# Patient Record
Sex: Male | Born: 1965
Health system: Southern US, Community
[De-identification: ages and names within clinical notes are randomized; demographics above are authoritative.]

## PROBLEM LIST (undated history)

## (undated) DIAGNOSIS — J45909 Unspecified asthma, uncomplicated: Secondary | ICD-10-CM

## (undated) DIAGNOSIS — J301 Allergic rhinitis due to pollen: Secondary | ICD-10-CM

## (undated) DIAGNOSIS — T7840XA Allergy, unspecified, initial encounter: Secondary | ICD-10-CM

## (undated) DIAGNOSIS — H409 Unspecified glaucoma: Secondary | ICD-10-CM

## (undated) HISTORY — DX: Allergy, unspecified, initial encounter: T78.40XA

## (undated) HISTORY — DX: Unspecified glaucoma: H40.9

## (undated) HISTORY — PX: EYE SURGERY: SHX253

## (undated) HISTORY — DX: Unspecified asthma, uncomplicated: J45.909

## (undated) HISTORY — PX: TONSILLECTOMY: SUR1361

---

## 2007-02-05 ENCOUNTER — Encounter (INDEPENDENT_AMBULATORY_CARE_PROVIDER_SITE_OTHER): Payer: Self-pay | Admitting: Ophthalmology

## 2007-02-05 ENCOUNTER — Ambulatory Visit (HOSPITAL_COMMUNITY): Admission: RE | Admit: 2007-02-05 | Discharge: 2007-02-05 | Payer: Self-pay | Admitting: Ophthalmology
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
## 2010-10-14 LAB — BASIC METABOLIC PANEL
BUN: 15
Creatinine, Ser: 1.1
GFR calc non Af Amer: >60
Glucose, Bld: 96

## 2010-10-14 LAB — CBC
MCHC: 33.4
Platelets: 168
RDW: 13.4

## 2010-10-14 LAB — TISSUE CULTURE: Culture: NO GROWTH

## 2010-10-14 LAB — ANAEROBIC CULTURE

## 2014-03-26 ENCOUNTER — Emergency Department (HOSPITAL_COMMUNITY): Payer: BLUE CROSS/BLUE SHIELD

## 2014-03-26 ENCOUNTER — Encounter (HOSPITAL_COMMUNITY): Payer: Self-pay | Admitting: *Deleted

## 2014-03-26 ENCOUNTER — Inpatient Hospital Stay (HOSPITAL_COMMUNITY)
Admission: EM | Admit: 2014-03-26 | Discharge: 2014-03-29 | DRG: 087 | Disposition: A | Discharge status: Home / Self Care | Payer: BLUE CROSS/BLUE SHIELD | Attending: Neurosurgery | Admitting: Neurosurgery

## 2014-03-26 DIAGNOSIS — S0219XA Other fracture of base of skull, initial encounter for closed fracture: Principal | ICD-10-CM | POA: Diagnosis present

## 2014-03-26 DIAGNOSIS — S065XAA Traumatic subdural hemorrhage with loss of consciousness status unknown, initial encounter: Secondary | ICD-10-CM | POA: Diagnosis present

## 2014-03-26 DIAGNOSIS — S065X1A Traumatic subdural hemorrhage with loss of consciousness of 30 minutes or less, initial encounter: Secondary | ICD-10-CM | POA: Diagnosis not present

## 2014-03-26 DIAGNOSIS — R40241 Glasgow coma scale score 13-15: Secondary | ICD-10-CM | POA: Diagnosis present

## 2014-03-26 DIAGNOSIS — S065X9A Traumatic subdural hemorrhage with loss of consciousness of unspecified duration, initial encounter: Secondary | ICD-10-CM | POA: Diagnosis present

## 2014-03-26 DIAGNOSIS — S01511A Laceration without foreign body of lip, initial encounter: Secondary | ICD-10-CM | POA: Diagnosis present

## 2014-03-26 DIAGNOSIS — S02119A Unspecified fracture of occiput, initial encounter for closed fracture: Secondary | ICD-10-CM | POA: Diagnosis present

## 2014-03-26 HISTORY — DX: Allergic rhinitis due to pollen: J30.1

## 2014-03-26 LAB — COMPREHENSIVE METABOLIC PANEL
ALK PHOS: 44 U/L (ref 39–117)
ALT: 23 U/L (ref 0–53)
ANION GAP: 11 (ref 5–15)
AST: 28 U/L (ref 0–37)
Albumin: 4.4 g/dL (ref 3.5–5.2)
BILIRUBIN TOTAL: 0.8 mg/dL (ref 0.3–1.2)
BUN: 12 mg/dL (ref 6–23)
CHLORIDE: 103 mmol/L (ref 96–112)
CO2: 23 mmol/L (ref 19–32)
CREATININE: 1.05 mg/dL (ref 0.50–1.35)
Calcium: 9.1 mg/dL (ref 8.4–10.5)
GFR, EST NON AFRICAN AMERICAN: 83 mL/min — AB (ref >90)
GLUCOSE: 139 mg/dL — AB (ref 70–99)
POTASSIUM: 3.4 mmol/L — AB (ref 3.5–5.1)
Sodium: 137 mmol/L (ref 135–145)
Total Protein: 7.1 g/dL (ref 6.0–8.3)

## 2014-03-26 LAB — CBC WITH DIFFERENTIAL/PLATELET
BASOS PCT: 0 % (ref 0–1)
Basophils Absolute: 0 10*3/uL (ref 0.0–0.1)
EOS ABS: 0 10*3/uL (ref 0.0–0.7)
EOS PCT: 0 % (ref 0–5)
HEMATOCRIT: 40.6 % (ref 39.0–52.0)
HEMOGLOBIN: 13.5 g/dL (ref 13.0–17.0)
LYMPHS ABS: 1.3 10*3/uL (ref 0.7–4.0)
Lymphocytes Relative: 10 % — ABNORMAL LOW (ref 12–46)
MCH: 31.1 pg (ref 26.0–34.0)
MCHC: 33.3 g/dL (ref 30.0–36.0)
MCV: 93.5 fL (ref 78.0–100.0)
MONO ABS: 1.2 10*3/uL — AB (ref 0.1–1.0)
MONOS PCT: 9 % (ref 3–12)
NEUTROS ABS: 10.9 10*3/uL — AB (ref 1.7–7.7)
NEUTROS PCT: 81 % — AB (ref 43–77)
PLATELETS: 131 10*3/uL — AB (ref 150–400)
RBC: 4.34 MIL/uL (ref 4.22–5.81)
RDW: 13.7 % (ref 11.5–15.5)
WBC: 13.4 10*3/uL — ABNORMAL HIGH (ref 4.0–10.5)

## 2014-03-26 LAB — PROTIME-INR
INR: 1.12 (ref 0.00–1.49)
Prothrombin Time: 14.6 seconds (ref 11.6–15.2)

## 2014-03-26 LAB — ETHANOL: Alcohol, Ethyl (B): 43 mg/dL — ABNORMAL HIGH (ref 0–9)

## 2014-03-26 MED ORDER — TETANUS-DIPHTH-ACELL PERTUSSIS 5-2.5-18.5 LF-MCG/0.5 IM SUSP
0.5000 mL | Freq: Once | INTRAMUSCULAR | Status: AC
  Administered 2014-03-27: 0.5 mL via INTRAMUSCULAR
  Filled 2014-03-26: qty 0.5

## 2014-03-26 MED ORDER — PROMETHAZINE HCL 25 MG/ML IJ SOLN
12.5000 mg | Freq: Once | INTRAMUSCULAR | Status: AC
  Administered 2014-03-27: 12.5 mg via INTRAVENOUS
  Filled 2014-03-26: qty 1

## 2014-03-26 MED ORDER — ONDANSETRON HCL 4 MG/2ML IJ SOLN
4.0000 mg | Freq: Once | INTRAMUSCULAR | Status: AC
  Administered 2014-03-26: 4 mg via INTRAVENOUS
  Filled 2014-03-26: qty 2

## 2014-03-26 MED ORDER — LIDOCAINE-EPINEPHRINE (PF) 2 %-1:200000 IJ SOLN
20.0000 mL | Freq: Once | INTRAMUSCULAR | Status: AC
  Administered 2014-03-27: 10 mL
  Filled 2014-03-26: qty 20
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
## 2014-03-27 ENCOUNTER — Encounter (HOSPITAL_COMMUNITY): Payer: Self-pay

## 2014-03-27 ENCOUNTER — Inpatient Hospital Stay (HOSPITAL_COMMUNITY): Payer: BLUE CROSS/BLUE SHIELD

## 2014-03-27 DIAGNOSIS — S01511A Laceration without foreign body of lip, initial encounter: Secondary | ICD-10-CM | POA: Diagnosis present

## 2014-03-27 DIAGNOSIS — S065X9A Traumatic subdural hemorrhage with loss of consciousness of unspecified duration, initial encounter: Secondary | ICD-10-CM | POA: Diagnosis present

## 2014-03-27 DIAGNOSIS — S02119A Unspecified fracture of occiput, initial encounter for closed fracture: Secondary | ICD-10-CM | POA: Diagnosis present

## 2014-03-27 DIAGNOSIS — S0219XA Other fracture of base of skull, initial encounter for closed fracture: Secondary | ICD-10-CM | POA: Diagnosis present

## 2014-03-27 DIAGNOSIS — R40241 Glasgow coma scale score 13-15: Secondary | ICD-10-CM | POA: Diagnosis present

## 2014-03-27 DIAGNOSIS — S065X1A Traumatic subdural hemorrhage with loss of consciousness of 30 minutes or less, initial encounter: Secondary | ICD-10-CM | POA: Diagnosis present

## 2014-03-27 DIAGNOSIS — S065XAA Traumatic subdural hemorrhage with loss of consciousness status unknown, initial encounter: Secondary | ICD-10-CM | POA: Diagnosis present

## 2014-03-27 LAB — URINALYSIS, ROUTINE W REFLEX MICROSCOPIC
Bilirubin Urine: NEGATIVE
GLUCOSE, UA: NEGATIVE mg/dL
HGB URINE DIPSTICK: NEGATIVE
KETONES UR: 40 mg/dL — AB
Leukocytes, UA: NEGATIVE
NITRITE: NEGATIVE
PROTEIN: NEGATIVE mg/dL
Specific Gravity, Urine: 1.023 (ref 1.005–1.030)
UROBILINOGEN UA: 0.2 mg/dL (ref 0.0–1.0)
pH: 6 (ref 5.0–8.0)

## 2014-03-27 LAB — RAPID URINE DRUG SCREEN, HOSP PERFORMED
Amphetamines: NOT DETECTED
BENZODIAZEPINES: NOT DETECTED
Barbiturates: NOT DETECTED
COCAINE: NOT DETECTED
OPIATES: NOT DETECTED
Tetrahydrocannabinol: NOT DETECTED

## 2014-03-27 MED ORDER — OXYCODONE HCL 5 MG PO TABS
5.0000 mg | ORAL_TABLET | ORAL | Status: DC | PRN
  Administered 2014-03-27 – 2014-03-29 (×8): 5 mg via ORAL
  Filled 2014-03-27 (×8): qty 1

## 2014-03-27 MED ORDER — ACETAMINOPHEN 650 MG RE SUPP: 650.0000 mg | Freq: Four times a day (QID) | RECTAL | Status: DC | PRN

## 2014-03-27 MED ORDER — POTASSIUM CHLORIDE IN NACL 20-0.9 MEQ/L-% IV SOLN
INTRAVENOUS | Status: DC
  Administered 2014-03-27 – 2014-03-28 (×3): via INTRAVENOUS
  Administered 2014-03-28: 1000 mL via INTRAVENOUS
  Filled 2014-03-27 (×5): qty 1000

## 2014-03-27 MED ORDER — ACETAMINOPHEN 325 MG PO TABS: 650.0000 mg | ORAL_TABLET | Freq: Four times a day (QID) | ORAL | Status: DC | PRN

## 2014-03-27 MED ORDER — HYDROMORPHONE HCL 1 MG/ML IJ SOLN
0.5000 mg | INTRAMUSCULAR | Status: DC | PRN
  Administered 2014-03-27 – 2014-03-28 (×5): 0.5 mg via INTRAVENOUS
  Filled 2014-03-27 (×5): qty 1

## 2014-03-27 MED ORDER — ONDANSETRON HCL 4 MG/2ML IJ SOLN
4.0000 mg | Freq: Four times a day (QID) | INTRAMUSCULAR | Status: DC | PRN
  Administered 2014-03-27: 4 mg via INTRAVENOUS
  Filled 2014-03-27: qty 2
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
## 2014-03-27 NOTE — Progress Notes (Signed)
Patient ID: Dustin Mitchell, male   DOB: 06/24/1965, 49 y.o.   MRN: [id number] Patient tells better this morning less headache less nausea still denies any numbness and tingling in his arms or his legs.  Awake alert and oriented left pupil reactive strength 5 out of 5  CT scan stable and improving.  Mobilized day transfer to the floor when patient's tolerating by mouth and nausea well-controlled patient to be discharged home.
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
## 2014-03-27 NOTE — Progress Notes (Signed)
UR completed.  Lauralei Clouse, RN BSN [facility] CCM Trauma/Neuro ICU Case Manager [phone]  
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
## 2014-03-28 MED ORDER — OXYCODONE HCL 5 MG PO TABS: 5.0000 mg | ORAL_TABLET | ORAL | Status: DC | PRN
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
## 2014-03-29 NOTE — Progress Notes (Signed)
Reviewed discharge instructions with patient and spouse. One rx given. Tolerated diet well last night and this morning and ambulated in the hallway several times. Vital signs stable and pain well controlled with oral pain med. Volunteers transported patient to the lobby via wheelchair. Pt was discharged at 1113.
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
## 2014-04-15 ENCOUNTER — Other Ambulatory Visit: Payer: Self-pay | Admitting: Neurosurgery

## 2014-04-15 DIAGNOSIS — S065X9A Traumatic subdural hemorrhage with loss of consciousness of unspecified duration, initial encounter: Secondary | ICD-10-CM

## 2014-04-15 DIAGNOSIS — S065XAA Traumatic subdural hemorrhage with loss of consciousness status unknown, initial encounter: Secondary | ICD-10-CM

## 2014-04-21 ENCOUNTER — Ambulatory Visit
Admission: RE | Admit: 2014-04-21 | Discharge: 2014-04-21 | Disposition: A | Discharge status: Home / Self Care | Payer: BLUE CROSS/BLUE SHIELD | Source: Ambulatory Visit | Attending: Neurosurgery | Admitting: Neurosurgery

## 2014-04-21 DIAGNOSIS — S065XAA Traumatic subdural hemorrhage with loss of consciousness status unknown, initial encounter: Secondary | ICD-10-CM

## 2014-04-21 DIAGNOSIS — S065X9A Traumatic subdural hemorrhage with loss of consciousness of unspecified duration, initial encounter: Secondary | ICD-10-CM
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
## 2016-06-08 DIAGNOSIS — Z4421 Encounter for fitting and adjustment of artificial right eye: Secondary | ICD-10-CM | POA: Diagnosis not present

## 2016-06-23 DIAGNOSIS — Z9001 Acquired absence of eye: Secondary | ICD-10-CM | POA: Diagnosis not present

## 2016-06-23 DIAGNOSIS — H2512 Age-related nuclear cataract, left eye: Secondary | ICD-10-CM | POA: Diagnosis not present

## 2016-06-23 DIAGNOSIS — H02401 Unspecified ptosis of right eyelid: Secondary | ICD-10-CM | POA: Diagnosis not present

## 2016-06-23 DIAGNOSIS — H04201 Unspecified epiphora, right lacrimal gland: Secondary | ICD-10-CM | POA: Diagnosis not present

## 2016-07-11 ENCOUNTER — Ambulatory Visit (INDEPENDENT_AMBULATORY_CARE_PROVIDER_SITE_OTHER): Payer: BLUE CROSS/BLUE SHIELD | Admitting: Physician Assistant

## 2016-07-11 ENCOUNTER — Encounter: Payer: Self-pay | Admitting: Physician Assistant

## 2016-07-11 VITALS — BP 110/70 | HR 45 | Temp 97.5°F | Resp 16 | Ht 72.0 in | Wt 185.2 lb

## 2016-07-11 DIAGNOSIS — J029 Acute pharyngitis, unspecified: Secondary | ICD-10-CM | POA: Diagnosis not present

## 2016-07-11 LAB — POCT RAPID STREP A (OFFICE): Rapid Strep A Screen: NEGATIVE

## 2016-07-11 MED ORDER — AZITHROMYCIN 250 MG PO TABS: ORAL_TABLET | ORAL | 0 refills | Status: DC

## 2016-07-11 NOTE — Progress Notes (Signed)
   Dustin Mitchell  MRN: [id number] DOB: June 25, 1965  PCP: Patient, No Pcp Per  Subjective:  Pt is a 51 year old male who presents to clinic for sore throat x 3 weeks. Pain has come and gone over the past few weeks. When it first started it was 8/10 pain. He stayed in bed for a day or two. Pain improved and has been intermittent since that time. Today c/o 2/10 pain.  He has not taken anything to feel better. He endorses some runny nose and mild cough.  Denies fever, chills, difficulty swallowing, drooling, n/v, trismus.    Review of Systems  Constitutional: Negative for chills, diaphoresis, fatigue and fever.  HENT: Positive for postnasal drip, rhinorrhea and sore throat. Negative for sinus pain, sinus pressure, sneezing, trouble swallowing and voice change.   Respiratory: Positive for cough. Negative for chest tightness and shortness of breath.   Gastrointestinal: Negative for nausea and vomiting.  Psychiatric/Behavioral: Negative for sleep disturbance.    Patient Active Problem List   Diagnosis Date Noted  . SDH (subdural hematoma) (HCC) 03/27/2014    Current Outpatient Prescriptions on File Prior to Visit  Medication Sig Dispense Refill  . oxyCODONE (OXY IR/ROXICODONE) 5 MG immediate release tablet Take 1 tablet (5 mg total) by mouth every 4 (four) hours as needed for moderate pain. (Patient not taking: Reported on 07/11/2016) 60 tablet 0   No current facility-administered medications on file prior to visit.     Allergies  Allergen Reactions  . Penicillins     unknown     Objective:  BP 110/70 (BP Location: Left Arm, Cuff Size: Normal)   Pulse (!) 45   Temp 97.5 F (36.4 C) (Oral)   Resp 16   Ht 6' (1.829 m)   Wt 185 lb 3.2 oz (84 kg)   SpO2 100%   BMI 25.12 kg/m   Physical Exam  Constitutional: He is oriented to person, place, and time and well-developed, well-nourished, and in no distress. No distress.  HENT:  Right Ear: Tympanic membrane normal.  Left Ear:  Tympanic membrane normal.  Mouth/Throat: Mucous membranes are normal. Posterior oropharyngeal erythema present. No oropharyngeal exudate or posterior oropharyngeal edema.  Cardiovascular: Normal rate, regular rhythm and normal heart sounds.   Neurological: He is alert and oriented to person, place, and time. GCS score is 15.  Skin: Skin is warm and dry.  Psychiatric: Mood, memory, affect and judgment normal.  Vitals reviewed.   Results for orders placed or performed in visit on 07/11/16  POCT rapid strep A  Result Value Ref Range   Rapid Strep A Screen Negative Negative    Assessment and Plan :  1. Acute pharyngitis, unspecified etiology 2. Sore throat - azithromycin (ZITHROMAX) 250 MG tablet; Take 2 tabs PO x 1 dose, then 1 tab PO QD x 4 days  Dispense: 6 tablet; Refill: 0 - POCT rapid strep A - Culture, Group A Strep - Rapid strep is negative, suspect possible false negative due to length of illness. Will treat. Culture is pending. Supportive care discussed: salt water gargles, warm tea with honey, ibuprofen for pain, flonase and neti pot. RTC in 5-7 days if no improvement. He agrees with plan.   Marco CollieWhitney Jacobe Study, PA-C  [facility] 07/11/2016 2:27 PM

## 2016-07-11 NOTE — Patient Instructions (Addendum)
Come back if you are not better in 5-7 days. Please see below for home care tips. Warm tea with honey helps sore throat.  Try over-the-counter Flonase, this will help with possible post-nasal drip (PND), which is a big contributor to sore throats. Using nasal rinses (ie neti pot) will help reduce PND.   Thank you for coming in today. I hope you feel we met your needs.  Feel free to call [facility] if you have any questions or further requests.  Please consider signing up for MyChart if you do not already have it, as this is a great way to communicate with me.  Best,  Whitney McVey, PA-C   Pharyngitis Pharyngitis is redness, pain, and swelling (inflammation) of your pharynx. What are the causes? Pharyngitis is usually caused by infection. Most of the time, these infections are from viruses (viral) and are part of a cold. However, sometimes pharyngitis is caused by bacteria (bacterial). Pharyngitis can also be caused by allergies. Viral pharyngitis may be spread from person to person by coughing, sneezing, and personal items or utensils (cups, forks, spoons, toothbrushes). Bacterial pharyngitis may be spread from person to person by more intimate contact, such as kissing. What are the signs or symptoms? Symptoms of pharyngitis include:  Sore throat.  Tiredness (fatigue).  Low-grade fever.  Headache.  Joint pain and muscle aches.  Skin rashes.  Swollen lymph nodes.  Plaque-like film on throat or tonsils (often seen with bacterial pharyngitis).  How is this treated? Viral pharyngitis will usually get better in 3-4 days without the use of medicine. Bacterial pharyngitis is treated with medicines that kill germs (antibiotics). Follow these instructions at home:  Drink enough water and fluids to keep your urine clear or pale yellow.  Only take over-the-counter or prescription medicines as directed by your health care provider: ? If you are prescribed antibiotics, make sure you finish them  even if you start to feel better. ? Do not take aspirin.  Get lots of rest.  Gargle with 8 oz of salt water ( tsp of salt per 1 qt of water) as often as every 1-2 hours to soothe your throat.  Throat lozenges (if you are not at risk for choking) or sprays may be used to soothe your throat. Contact a health care provider if:  You have large, tender lumps in your neck.  You have a rash.  You cough up green, yellow-brown, or bloody spit. Get help right away if:  Your neck becomes stiff.  You drool or are unable to swallow liquids.  You vomit or are unable to keep medicines or liquids down.  You have severe pain that does not go away with the use of recommended medicines.  You have trouble breathing (not caused by a stuffy nose). This information is not intended to replace advice given to you by your health care provider. Make sure you discuss any questions you have with your health care provider. Document Released: 01/10/2005 Document Revised: 06/18/2015 Document Reviewed: 09/17/2012 Elsevier Interactive Patient Education  2017 [employer].   IF you received an x-ray today, you will receive an invoice from [facility]. Please contact [facility] at [phone] with questions or concerns regarding your invoice.   IF you received labwork today, you will receive an invoice from [place]. Please contact [facility] at [phone] with questions or concerns regarding your invoice.   Our billing staff will not be able to assist you with questions regarding bills from these companies.  You will be  contacted with the lab results as soon as they are available. The fastest way to get your results is to activate your My Chart account. Instructions are located on the last page of this paperwork. If you have not heard from [place] regarding the results in 2 weeks, please contact this office.

## 2016-07-13 LAB — CULTURE, GROUP A STREP: Strep A Culture: NEGATIVE

## 2016-08-17 DIAGNOSIS — D2212 Melanocytic nevi of left eyelid, including canthus: Secondary | ICD-10-CM | POA: Diagnosis not present

## 2016-11-17 DIAGNOSIS — Z4421 Encounter for fitting and adjustment of artificial right eye: Secondary | ICD-10-CM | POA: Diagnosis not present

## 2017-05-30 ENCOUNTER — Encounter: Payer: Self-pay | Admitting: Physician Assistant

## 2017-05-30 ENCOUNTER — Other Ambulatory Visit: Payer: Self-pay

## 2017-05-30 ENCOUNTER — Ambulatory Visit (INDEPENDENT_AMBULATORY_CARE_PROVIDER_SITE_OTHER): Payer: BLUE CROSS/BLUE SHIELD | Admitting: Physician Assistant

## 2017-05-30 VITALS — BP 130/78 | HR 55 | Temp 98.5°F | Resp 16 | Ht 71.5 in | Wt 188.8 lb

## 2017-05-30 DIAGNOSIS — Z1329 Encounter for screening for other suspected endocrine disorder: Secondary | ICD-10-CM

## 2017-05-30 DIAGNOSIS — Z13220 Encounter for screening for lipoid disorders: Secondary | ICD-10-CM

## 2017-05-30 DIAGNOSIS — Z1211 Encounter for screening for malignant neoplasm of colon: Secondary | ICD-10-CM | POA: Diagnosis not present

## 2017-05-30 DIAGNOSIS — Z130 Encounter for screening for diseases of the blood and blood-forming organs and certain disorders involving the immune mechanism: Secondary | ICD-10-CM

## 2017-05-30 DIAGNOSIS — Z0000 Encounter for general adult medical examination without abnormal findings: Secondary | ICD-10-CM | POA: Diagnosis not present

## 2017-05-30 DIAGNOSIS — L255 Unspecified contact dermatitis due to plants, except food: Secondary | ICD-10-CM

## 2017-05-30 DIAGNOSIS — J45990 Exercise induced bronchospasm: Secondary | ICD-10-CM | POA: Diagnosis not present

## 2017-05-30 DIAGNOSIS — Z13228 Encounter for screening for other metabolic disorders: Secondary | ICD-10-CM | POA: Diagnosis not present

## 2017-05-30 LAB — POCT URINALYSIS DIP (MANUAL ENTRY)
Bilirubin, UA: NEGATIVE
Blood, UA: NEGATIVE
Glucose, UA: NEGATIVE mg/dL
Ketones, POC UA: NEGATIVE mg/dL
Leukocytes, UA: NEGATIVE
Nitrite, UA: NEGATIVE
Protein Ur, POC: NEGATIVE mg/dL
Spec Grav, UA: 1.01 (ref 1.010–1.025)
Urobilinogen, UA: 0.2 U/dL
pH, UA: 6 (ref 5.0–8.0)

## 2017-05-30 MED ORDER — CLOBETASOL PROPIONATE 0.05 % EX LOTN: 1.0000 "application " | TOPICAL_LOTION | Freq: Two times a day (BID) | CUTANEOUS | 0 refills | Status: DC

## 2017-05-30 MED ORDER — ALBUTEROL SULFATE HFA 108 (90 BASE) MCG/ACT IN AERS: 2.0000 | INHALATION_SPRAY | RESPIRATORY_TRACT | 1 refills | Status: DC | PRN

## 2017-05-30 NOTE — Progress Notes (Signed)
Primary Care at [place], [place] [address] [phone]  Date:  05/30/2017   Name:  Dustin Mitchell   DOB:  [birth year]   MRN:  [id number]  PCP:  Patient, No Pcp Per    Chief Complaint: Annual Exam   History of Present Illness:  This is a 52 y.o. male who is presenting for CPE. This is his first annual in several years.   Complaints:   Plant dermatitis - develops rashes every summer while working in the yard "there is poison ivy everywhere".  Exercise induced asthma. Goes for 5 hour bike rides chest feels really tight after a few hours and cannot take deep breath. Takes about 1 hour to resolve. Nothing makes it better but time. No h/o asthma.  Immunizations: utd Dentist: regularly.  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  Diet: healthy  Exercise: often. Road bikes  Fam hx: family history includes Heart disease in his maternal grandfather and maternal grandmother.  Sexual hx: active with wife only. Not concerned for STDs today.  Urinary hesitancy/frequency or nocturia: none Problems with erectile dysfunction: none.  Tobacco/alcohol/substance use: never smoker; drinks 6 oz alcohol/week; no drug use.   Colonoscopy: never.    Review of Systems:  Review of Systems  Constitutional: Negative for activity change, appetite change and fatigue.  HENT: Negative for congestion, dental problem, sneezing and tinnitus.   Eyes: Negative for visual disturbance.  Respiratory: Positive for chest tightness. Negative for cough, shortness of breath and wheezing.   Cardiovascular: Negative for chest pain, palpitations and leg swelling.  Gastrointestinal: Negative for abdominal pain, blood in stool, constipation, diarrhea, nausea and vomiting.  Endocrine: Negative for polydipsia, polyphagia and polyuria.  Genitourinary: Negative for decreased urine volume, difficulty urinating, discharge, hematuria, scrotal swelling and testicular pain.  Musculoskeletal:  Negative for arthralgias, back pain and neck stiffness.  Skin: Positive for rash.  Allergic/Immunologic: Negative for environmental allergies and food allergies.  Neurological: Negative for dizziness, syncope, weakness, light-headedness and headaches.  Psychiatric/Behavioral: Negative for sleep disturbance. The patient is not nervous/anxious.     Patient Active Problem List   Diagnosis Date Noted  . SDH (subdural hematoma) ([place]) 03/27/2014    Prior to Admission medications   Medication Sig Start Date End Date Taking? Authorizing Provider  cetirizine (ZYRTEC) 10 MG tablet Take 10 mg by mouth daily.   Yes [provider]    Allergies  Allergen Reactions  . Penicillins     unknown    Past Surgical History:  Procedure Laterality Date  . EYE SURGERY      Social History   Tobacco Use  . Smoking status: Never Smoker  . Smokeless tobacco: Never Used  Substance Use Topics  . Alcohol use: Yes    Alcohol/week: 6.0 oz    Types: 10 Glasses of wine per week  . Drug use: Never    Family History  Problem Relation Age of Onset  . Heart disease Maternal Grandmother   . Heart disease Maternal Grandfather     Medication list has been reviewed and updated.  Physical Examination:  Physical Exam  Constitutional: He is oriented to person, place, and time. No distress.  HENT:  Head: Normocephalic and atraumatic.  Right Ear: External ear normal.  Left Ear: External ear normal.  Nose: Nose normal.  Mouth/Throat: Oropharynx is clear and moist. No oropharyngeal exudate.  Eyes: Pupils are equal, round, and reactive to light. Conjunctivae and EOM are normal. Right eye  exhibits no discharge. No scleral icterus.  Neck: Normal range of motion. Neck supple. No tracheal deviation present. No thyromegaly present.  Cardiovascular: Normal rate, regular rhythm and intact distal pulses.  No murmur heard. Pulmonary/Chest: Effort normal and breath sounds normal. No respiratory distress.  He has no wheezes.  Abdominal: Soft. Bowel sounds are normal. He exhibits no distension and no mass. There is no tenderness.  Genitourinary: Penis normal. No discharge found.  Musculoskeletal: Normal range of motion.  Lymphadenopathy:    He has no cervical adenopathy.  Neurological: He is alert and oriented to person, place, and time. He has normal reflexes.  Skin: Skin is warm and dry. He is not diaphoretic.  Psychiatric: Judgment normal.    BP 130/78 (BP Location: Left Arm, Patient Position: Sitting, Cuff Size: Normal)   Pulse (!) 55   Temp 98.5 F (36.9 C) (Oral)   Resp 16   Ht 5' 11.5" (1.816 m)   Wt 188 lb 12.8 oz (85.6 kg)   SpO2 98%   BMI 25.97 kg/m   Results for orders placed or performed in visit on 05/30/17  POCT urinalysis dipstick  Result Value Ref Range   Color, UA yellow yellow   Clarity, UA clear clear   Glucose, UA negative negative mg/dL   Bilirubin, UA negative negative   Ketones, POC UA negative negative mg/dL   Spec Grav, UA 1.010 1.010 - 1.025   Blood, UA negative negative   pH, UA 6.0 5.0 - 8.0   Protein Ur, POC negative negative mg/dL   Urobilinogen, UA 0.2 0.2 or 1.0 E.U./dL   Nitrite, UA Negative Negative   Leukocytes, UA Negative Negative    Assessment and Plan: 1. Annual physical exam - Pt presents for his first annual exam in several years. C/o exercise induced asthma and plant dermatitis. Plan to treat for both. Routine labs are pending - will contact with results. He is due for his colonoscopy- referral placed.  2. Screening for endocrine, metabolic and immunity disorder - CBC with Differential/Platelet - CMP14+EGFR - POCT urinalysis dipstick  3. Screening, lipid - Lipid panel  4. Screen for colon cancer - Ambulatory referral to Gastroenterology  5. Screening for thyroid disorder - Thyroid Panel With TSH  6. Plant dermatitis - Clobetasol Propionate 0.05 % lotion; Apply 1 application topically 2 (two) times daily.  Dispense: 59 mL;  Refill: 0  7. Exercise-induced asthma - albuterol (PROVENTIL HFA;VENTOLIN HFA) 108 (90 Base) MCG/ACT inhaler; Inhale 2 puffs into the lungs every 4 (four) hours as needed for wheezing or shortness of breath (cough, shortness of breath or wheezing.).  Dispense: 1 Inhaler; Refill: 1  Whitney Carden Teel, PA-C  Primary [facility] at [place] 05/30/2017 3:03 PM

## 2017-05-30 NOTE — Patient Instructions (Addendum)
Zantac and Zyrtec for complete coverage for allergic dermatitis. Continue Benadryl topically as needed.  Consider acupuncture for allergic dermatitis [facility] or [place])  You will receive a phone call to schedule an appointment for colonoscopy.   We will contact you with your lab results - if everything looks great we will send a letter in the mail. We will call if anything is abnormal.   Thank you for allowing me to take part in your health care. Feel free to call PCP if you have any questions or further requests.  Please consider signing up for MyChart if you do not already have it, as this is a great way to communicate with me.  Best,  Mercer Pod, PA-C   Health Maintenance, Male A healthy lifestyle and preventive care is important for your health and wellness. Ask your health care provider about what schedule of regular examinations is right for you. What should I know about weight and diet? Eat a Healthy Diet  Eat plenty of vegetables, fruits, whole grains, low-fat dairy products, and lean protein.  Do not eat a lot of foods high in solid fats, added sugars, or salt.  Maintain a Healthy Weight Regular exercise can help you achieve or maintain a healthy weight. You should:  Do at least 150 minutes of exercise each week. The exercise should increase your heart rate and make you sweat (moderate-intensity exercise).  Do strength-training exercises at least twice a week.  Watch Your Levels of Cholesterol and Blood Lipids  Have your blood tested for lipids and cholesterol every 5 years starting at 51 years of age. If you are at high risk for heart disease, you should start having your blood tested when you are 52 years old. You may need to have your cholesterol levels checked more often if: ? Your lipid or cholesterol levels are high. ? You are older than 51 years of age. ? You are at high risk for heart disease.  What should I know about cancer screening? Many types  of cancers can be detected early and may often be prevented. Lung Cancer  You should be screened every year for lung cancer if: ? You are a current smoker who has smoked for at least 30 years. ? You are a former smoker who has quit within the past 15 years.  Talk to your health care provider about your screening options, when you should start screening, and how often you should be screened.  Colorectal Cancer  Routine colorectal cancer screening usually begins at 51 years of age and should be repeated every 5-10 years until you are 51 years old. You may need to be screened more often if early forms of precancerous polyps or small growths are found. Your health care provider may recommend screening at an earlier age if you have risk factors for colon cancer.  Your health care provider may recommend using home test kits to check for hidden blood in the stool.  A small camera at the end of a tube can be used to examine your colon (sigmoidoscopy or colonoscopy). This checks for the earliest forms of colorectal cancer.  Prostate and Testicular Cancer  Depending on your age and overall health, your health care provider may do certain tests to screen for prostate and testicular cancer.  Talk to your health care provider about any symptoms or concerns you have about testicular or prostate cancer.  Skin Cancer  Check your skin from head to toe regularly.  Tell your health  care provider about any new moles or changes in moles, especially if: ? There is a change in a mole's size, shape, or color. ? You have a mole that is larger than a pencil eraser.  Always use sunscreen. Apply sunscreen liberally and repeat throughout the day.  Protect yourself by wearing long sleeves, pants, a wide-brimmed hat, and sunglasses when outside.  What should I know about heart disease, diabetes, and high blood pressure?  If you are 79-18 years of age, have your blood pressure checked every 3-5 years. If you  are 67 years of age or older, have your blood pressure checked every year. You should have your blood pressure measured twice-once when you are at a hospital or clinic, and once when you are not at a hospital or clinic. Record the average of the two measurements. To check your blood pressure when you are not at a hospital or clinic, you can use: ? An automated blood pressure machine at a pharmacy. ? A home blood pressure monitor.  Talk to your health care provider about your target blood pressure.  If you are between 50-34 years old, ask your health care provider if you should take aspirin to prevent heart disease.  Have regular diabetes screenings by checking your fasting blood sugar level. ? If you are at a normal weight and have a low risk for diabetes, have this test once every three years after the age of 58. ? If you are overweight and have a high risk for diabetes, consider being tested at a younger age or more often.  A one-time screening for abdominal aortic aneurysm (AAA) by ultrasound is recommended for men aged 37-75 years who are current or former smokers. What should I know about preventing infection? Hepatitis B If you have a higher risk for hepatitis B, you should be screened for this virus. Talk with your health care provider to find out if you are at risk for hepatitis B infection. Hepatitis C Blood testing is recommended for:  Everyone born from [birth year] through 1964.  Anyone with known risk factors for hepatitis C.  Sexually Transmitted Diseases (STDs)  You should be screened each year for STDs including gonorrhea and chlamydia if: ? You are sexually active and are younger than 51 years of age. ? You are older than 51 years of age and your health care provider tells you that you are at risk for this type of infection. ? Your sexual activity has changed since you were last screened and you are at an increased risk for chlamydia or gonorrhea. Ask your health care provider if  you are at risk.  Talk with your health care provider about whether you are at high risk of being infected with HIV. Your health care provider may recommend a prescription medicine to help prevent HIV infection.  What else can I do?  Schedule regular health, dental, and eye exams.  Stay current with your vaccines (immunizations).  Do not use any tobacco products, such as cigarettes, chewing tobacco, and e-cigarettes. If you need help quitting, ask your health care provider.  Limit alcohol intake to no more than 2 drinks per day. One drink equals 12 ounces of beer, 5 ounces of wine, or 1 ounces of hard liquor.  Do not use street drugs.  Do not share needles.  Ask your health care provider for help if you need support or information about quitting drugs.  Tell your health care provider if you often feel depressed.  Tell your  health care provider if you have ever been abused or do not feel safe at home. This information is not intended to replace advice given to you by your health care provider. Make sure you discuss any questions you have with your health care provider. Document Released: 07/09/2007 Document Revised: 09/09/2015 Document Reviewed: 10/14/2014 Elsevier Interactive Patient Education  [employer].   Thank you for coming in today. I hope you feel we met your needs.  Feel free to call PCP if you have any questions or further requests.  Please consider signing up for MyChart if you do not already have it, as this is a great way to communicate with me.  Best,  Whitney McVey, PA-C  IF you received an x-ray today, you will receive an invoice from [facility]. Please contact [facility] at [phone] with questions or concerns regarding your invoice.   IF you received labwork today, you will receive an invoice from [place]. Please contact [facility] at [phone] with questions or concerns regarding your invoice.   Our billing staff will not be  able to assist you with questions regarding bills from these companies.  You will be contacted with the lab results as soon as they are available. The fastest way to get your results is to activate your My Chart account. Instructions are located on the last page of this paperwork. If you have not heard from [place] regarding the results in 2 weeks, please contact this office.

## 2017-05-31 LAB — CMP14+EGFR
ALT: 21 IU/L (ref 0–44)
AST: 30 IU/L (ref 0–40)
Albumin/Globulin Ratio: 2 (ref 1.2–2.2)
Albumin: 4.8 g/dL (ref 3.5–5.5)
Alkaline Phosphatase: 49 IU/L (ref 39–117)
BUN/Creatinine Ratio: 11 (ref 9–20)
BUN: 11 mg/dL (ref 6–24)
Bilirubin Total: 0.7 mg/dL (ref 0.0–1.2)
CO2: 24 mmol/L (ref 20–29)
Calcium: 9.5 mg/dL (ref 8.7–10.2)
Chloride: 100 mmol/L (ref 96–106)
Creatinine, Ser: 0.98 mg/dL (ref 0.76–1.27)
GFR calc Af Amer: 103 mL/min/{1.73_m2} (ref >59)
GFR calc non Af Amer: 90 mL/min/{1.73_m2} (ref >59)
Globulin, Total: 2.4 g/dL (ref 1.5–4.5)
Glucose: 86 mg/dL (ref 65–99)
Potassium: 3.8 mmol/L (ref 3.5–5.2)
Sodium: 141 mmol/L (ref 134–144)
Total Protein: 7.2 g/dL (ref 6.0–8.5)

## 2017-05-31 LAB — CBC WITH DIFFERENTIAL/PLATELET
Basophils Absolute: 0 10*3/uL (ref 0.0–0.2)
Basos: 1 %
EOS (ABSOLUTE): 0 10*3/uL (ref 0.0–0.4)
Eos: 1 %
Hematocrit: 40.7 % (ref 37.5–51.0)
Hemoglobin: 13.5 g/dL (ref 13.0–17.7)
Immature Grans (Abs): 0 10*3/uL (ref 0.0–0.1)
Immature Granulocytes: 0 %
Lymphocytes Absolute: 1.4 10*3/uL (ref 0.7–3.1)
Lymphs: 29 %
MCH: 30.8 pg (ref 26.6–33.0)
MCHC: 33.2 g/dL (ref 31.5–35.7)
MCV: 93 fL (ref 79–97)
Monocytes Absolute: 0.6 10*3/uL (ref 0.1–0.9)
Monocytes: 12 %
Neutrophils Absolute: 2.9 10*3/uL (ref 1.4–7.0)
Neutrophils: 57 %
Platelets: 174 10*3/uL (ref 150–379)
RBC: 4.38 x10E6/uL (ref 4.14–5.80)
RDW: 14.1 % (ref 12.3–15.4)
WBC: 5 10*3/uL (ref 3.4–10.8)

## 2017-05-31 LAB — THYROID PANEL WITH TSH
Free Thyroxine Index: 1.4 (ref 1.2–4.9)
T3 Uptake Ratio: 24 % (ref 24–39)
T4, Total: 5.9 ug/dL (ref 4.5–12.0)
TSH: 12.27 u[IU]/mL — ABNORMAL HIGH (ref 0.450–4.500)

## 2017-05-31 LAB — LIPID PANEL
Chol/HDL Ratio: 2.1 ratio (ref 0.0–5.0)
Cholesterol, Total: 189 mg/dL (ref 100–199)
HDL: 88 mg/dL (ref >39)
LDL Calculated: 91 mg/dL (ref 0–99)
Triglycerides: 52 mg/dL (ref 0–149)
VLDL Cholesterol Cal: 10 mg/dL (ref 5–40)

## 2017-06-05 ENCOUNTER — Encounter: Payer: Self-pay | Admitting: Physician Assistant

## 2017-06-05 ENCOUNTER — Other Ambulatory Visit: Payer: Self-pay | Admitting: Physician Assistant

## 2017-06-05 DIAGNOSIS — R7989 Other specified abnormal findings of blood chemistry: Secondary | ICD-10-CM

## 2017-06-22 DIAGNOSIS — H02401 Unspecified ptosis of right eyelid: Secondary | ICD-10-CM | POA: Diagnosis not present

## 2017-06-22 DIAGNOSIS — D2310 Other benign neoplasm of skin of unspecified eyelid, including canthus: Secondary | ICD-10-CM | POA: Diagnosis not present

## 2017-06-22 DIAGNOSIS — Z9001 Acquired absence of eye: Secondary | ICD-10-CM | POA: Diagnosis not present

## 2017-06-27 ENCOUNTER — Encounter: Payer: Self-pay | Admitting: Gastroenterology

## 2017-08-16 ENCOUNTER — Encounter: Payer: Self-pay | Admitting: Physician Assistant

## 2017-08-16 ENCOUNTER — Other Ambulatory Visit: Payer: Self-pay

## 2017-08-16 ENCOUNTER — Ambulatory Visit: Payer: BLUE CROSS/BLUE SHIELD | Admitting: Physician Assistant

## 2017-08-16 VITALS — BP 126/76 | HR 53 | Temp 99.2°F | Resp 16 | Ht 72.0 in | Wt 188.6 lb

## 2017-08-16 DIAGNOSIS — K120 Recurrent oral aphthae: Secondary | ICD-10-CM | POA: Diagnosis not present

## 2017-08-16 DIAGNOSIS — R6883 Chills (without fever): Secondary | ICD-10-CM | POA: Diagnosis not present

## 2017-08-16 DIAGNOSIS — K130 Diseases of lips: Secondary | ICD-10-CM

## 2017-08-16 DIAGNOSIS — K068 Other specified disorders of gingiva and edentulous alveolar ridge: Secondary | ICD-10-CM

## 2017-08-16 LAB — POCT CBC
Granulocyte percent: 56.8 %G (ref 37–80)
HCT, POC: 44.1 % (ref 43.5–53.7)
Hemoglobin: 14.1 g/dL (ref 14.1–18.1)
Lymph, poc: 1.7 (ref 0.6–3.4)
MCH, POC: 29.5 pg (ref 27–31.2)
MCHC: 32.1 g/dL (ref 31.8–35.4)
MCV: 92.1 fL (ref 80–97)
MID (cbc): 1 — AB (ref 0–0.9)
MPV: 8.4 fL (ref 0–99.8)
POC Granulocyte: 3.6 (ref 2–6.9)
POC LYMPH PERCENT: 27.3 %L (ref 10–50)
POC MID %: 15.9 %M — AB (ref 0–12)
Platelet Count, POC: 135 10*3/uL — AB (ref 142–424)
RBC: 4.78 M/uL (ref 4.69–6.13)
RDW, POC: 14.5 %
WBC: 6.3 10*3/uL (ref 4.6–10.2)

## 2017-08-16 LAB — POCT URINALYSIS DIP (MANUAL ENTRY)
Bilirubin, UA: NEGATIVE
Blood, UA: NEGATIVE
Glucose, UA: NEGATIVE mg/dL
Ketones, POC UA: NEGATIVE mg/dL
Leukocytes, UA: NEGATIVE
Nitrite, UA: NEGATIVE
Protein Ur, POC: NEGATIVE mg/dL
Spec Grav, UA: 1.01 (ref 1.010–1.025)
Urobilinogen, UA: 0.2 U/dL
pH, UA: 6 (ref 5.0–8.0)

## 2017-08-16 MED ORDER — DEXAMETHASONE 0.5 MG/5ML PO SOLN: 0.5000 mg | Freq: Three times a day (TID) | ORAL | 0 refills | Status: DC

## 2017-08-16 MED ORDER — DYCLONINE HCL 2 MG MT LOZG: 1.0000 | LOZENGE | Freq: Four times a day (QID) | OROMUCOSAL | 0 refills | Status: DC

## 2017-08-16 NOTE — Patient Instructions (Addendum)
I suspect this is a viral illness.  There are still several labs pending - I will contact you with the results when they come back.  Your platelet count is low - I will want to recheck this. i'll let you know the plan when the other labs come back.   Stay well hydrated.   For ulcers: ?Pain control - Topical anesthetics and coating agents can provide temporary relief of discomfort if used prior to eating and performing dental hygiene: .Dyclonine lozenges: dissolve slowly in mouth   Topical therapies-Topical corticosteroids are the first-line treatment for patients with mild to moderate RAS. They are more effective if initiated early in the course of an episode and used frequently for at least a few days.  ?Dexamethasone elixir 0.5 mg/5 cc: 5 mL swish and spit three to four times daily. It is important to keep the medication in the mouth for five minutes prior to spitting it out. Do not rinse afterward and avoid eating or drinking for 30 minutes.  If this does not work, we can try a short course of oral prednisone.   _____________________________________________________________ ____________________  Recurrent aphthous stomatitis (RAS), also known as "canker sores," is a common disease of the oral and, occasionally, genital mucosa characterized by the repeated development of one to many discrete, painful ulcers that usually heal within 7-14 days.    The cause of RAS is unknown and is likely multifactorial. It is possibly due to an exaggerated proinflammatory process and/or a relatively weak anti-inflammatory response. There appears to be a genetic predisposition to developing RAS.  Although certain foods may exacerbate RAS, there is no evidence to suggest an etiologic role for food allergy. Vitamin and mineral deficiencies have also been implicated in the pathogenesis of RAS.  General measures to help heal RAS: ?Oral hygiene - It is important to maintain good dental hygiene while at the  same time avoiding trauma. A soft toothbrush, waxed tape-style dental floss, and a soft-tipped gum stimulator to gently remove plaque are generally well tolerated. A nonalcohol-containing mouthwash is often less irritating, but still effective, in decreasing microbial overgrowth. Toothpaste containing sodium lauryl sulfate (SLS) may exacerbate RAS in some patients. Less aggressive, more frequent professional dental cleaning is advised. ?Avoidance of exacerbating factors - Where possible, reduce traumatic factors inside the mouth (eg, sharp/rough dental restorations, braces). Avoid habits that cause trauma (eg, biting cheeks or lips) and foods that seem to exacerbate the process.   Vitamin B12 - Patients with simple RAS in whom a nutritional deficiency is documented (eg, vitamin B12, folate, iron, zinc) may respond to the appropriate supplement. vitamin B12 may be beneficial in all patients with RAS: Vitamin B12 1,000 mcg daily for at least 6 months.    PROGNOSIS-The prognosis for patients with simple aphthosis is excellent. It is a self-limited disease that can periodically result in moderate discomfort but is otherwise well tolerated. In a minority of patients with complex aphthosis, the pain and frequency of outbreaks may have a significant impact on the overall quality of life.  RAS tends to improve as patients age.      Thank you for coming in today. I hope you feel we met your needs.  Feel free to call PCP if you have any questions or further requests.  Please consider signing up for MyChart if you do not already have it, as this is a great way to communicate with me.  Best,  Dustin McVey, PA-C  IF you received an x-ray today, you will  receive an Pharmacologist from [facility]. Please contact [facility] at [phone] with questions or concerns regarding your invoice.   IF you received labwork today, you will receive an invoice from [place]. Please contact [facility] at  [phone] with questions or concerns regarding your invoice.   Our billing staff will not be able to assist you with questions regarding bills from these companies.  You will be contacted with the lab results as soon as they are available. The fastest way to get your results is to activate your My Chart account. Instructions are located on the last page of this paperwork. If you have not heard from [place] regarding the results in 2 weeks, please contact this office.

## 2017-08-16 NOTE — Progress Notes (Signed)
Dustin Mitchell  MRN: [id number] DOB: 1965-08-05  PCP: Patient, No Pcp Per  Subjective:  Pt is a 52 year old male who presents to clinic for body aches x 2 days.  Symptoms came on rapidly 2 days ago with body aches, fatigue, chills. Fever broke last night and he became very sweaty.  He was brushing his teeth last night and this morning and saw blood in his sputum. Endorses a few lesions on his face and sores in his mouth.  Feels a little better this morning than he did yesterday, but "I feel like I got run over".   He returned home from [place] 3 weeks ago. Denies high risk behaviors.  No new medications or supplements. Denies rash on his penis.  Denies rash, nausea, vomiting, cough, congestion, chest pain, urinary symptoms, headache, neck stiffness, vision changes, joint pain, dizziness  Review of Systems  Constitutional: Positive for chills, diaphoresis, fatigue and fever.  HENT: Negative for congestion, postnasal drip, rhinorrhea, sinus pressure, sinus pain, sneezing, sore throat and trouble swallowing.   Eyes: Negative for pain and redness.  Respiratory: Negative for cough, chest tightness, shortness of breath and wheezing.   Cardiovascular: Negative for chest pain and palpitations.  Gastrointestinal: Negative for abdominal pain, diarrhea, nausea and vomiting.  Endocrine: Negative for polydipsia.  Musculoskeletal: Negative for neck pain and neck stiffness.  Skin: Positive for wound (lesions of face ). Negative for rash.  Neurological: Negative for dizziness, syncope, weakness and headaches.    Patient Active Problem List   Diagnosis Date Noted  . Exercise-induced asthma 05/30/2017  . SDH (subdural hematoma) ([facility]) 03/27/2014    Current Outpatient Medications on File Prior to Visit  Medication Sig Dispense Refill  . albuterol (PROVENTIL HFA;VENTOLIN HFA) 108 (90 Base) MCG/ACT inhaler Inhale 2 puffs into the lungs every 4 (four) hours as needed for wheezing or shortness of  breath (cough, shortness of breath or wheezing.). 1 Inhaler 1  . cetirizine (ZYRTEC) 10 MG tablet Take 10 mg by mouth daily.    . Clobetasol Propionate 0.05 % lotion Apply 1 application topically 2 (two) times daily. 59 mL 0   No current facility-administered medications on file prior to visit.     Allergies  Allergen Reactions  . Penicillins     unknown     Objective:  BP 126/76 (BP Location: Left Arm, Patient Position: Sitting, Cuff Size: Normal)   Pulse (!) 53   Temp 99.2 F (37.3 C) (Oral)   Resp 16   Ht 6' (1.829 m)   Wt 188 lb 9.6 oz (85.5 kg)   SpO2 100%   BMI 25.58 kg/m   Physical Exam  Constitutional: He is oriented to person, place, and time. No distress.  Back is sweaty  HENT:  Head:    Right Ear: Tympanic membrane normal.  Left Ear: Tympanic membrane normal.  Nose: Mucosal edema present. No rhinorrhea. Right sinus exhibits no maxillary sinus tenderness and no frontal sinus tenderness. Left sinus exhibits no maxillary sinus tenderness and no frontal sinus tenderness.  Mouth/Throat: Oropharynx is clear and moist and mucous membranes are normal. Oral lesions present.  Multiple aphthous ulcers of bugle mucosa.  Tongue is not glossy.  No bleeding from gums.   3 nodular lesions of face mildly excoriated  Cardiovascular: Normal rate, regular rhythm and normal heart sounds.  Pulmonary/Chest: Effort normal and breath sounds normal. No respiratory distress. He has no wheezes. He has no rales.  Neurological: He is alert and oriented to person,  place, and time.  Skin: Skin is warm and dry.  Psychiatric: Judgment normal.  Vitals reviewed.   Results for orders placed or performed in visit on 08/16/17  POCT CBC  Result Value Ref Range   WBC 6.3 4.6 - 10.2 K/uL   Lymph, poc 1.7 0.6 - 3.4   POC LYMPH PERCENT 27.3 10 - 50 %L   MID (cbc) 1.0 (A) 0 - 0.9   POC MID % 15.9 (A) 0 - 12 %M   POC Granulocyte 3.6 2 - 6.9   Granulocyte percent 56.8 37 - 80 %G   RBC 4.78 4.69 -  6.13 M/uL   Hemoglobin 14.1 14.1 - 18.1 g/dL   HCT, POC 44.1 43.5 - 53.7 %   MCV 92.1 80 - 97 fL   MCH, POC 29.5 27 - 31.2 pg   MCHC 32.1 31.8 - 35.4 g/dL   RDW, POC 14.5 %   Platelet Count, POC 135 (A) 142 - 424 K/uL   MPV 8.4 0 - 99.8 fL  POCT urinalysis dipstick  Result Value Ref Range   Color, UA yellow yellow   Clarity, UA clear clear   Glucose, UA negative negative mg/dL   Bilirubin, UA negative negative   Ketones, POC UA negative negative mg/dL   Spec Grav, UA 1.010 1.010 - 1.025   Blood, UA negative negative   pH, UA 6.0 5.0 - 8.0   Protein Ur, POC negative negative mg/dL   Urobilinogen, UA 0.2 0.2 or 1.0 E.U./dL   Nitrite, UA Negative Negative   Leukocytes, UA Negative Negative    Assessment and Plan :  1. Chills - Patient presents complaining of 2 days of body aches, fever, abscess ulcers, bleeding gums.  Clinically he is improving.  His vitals are stable.  White blood cell count is within normal limits, low platelets at 135, negative UA.  Suspect viral illness.  Labs and HSV culture are pending.  Will contact with results.  Return to clinic in 48 hours if symptoms do not improve - POCT urinalysis dipstick  2. Bleeding gums - Vitamin B12 - PT AND PTT - Pathologist smear review - POCT CBC - CMP14+EGFR - C-reactive protein - Sedimentation Rate  3. Lip ulcer - Herpes simplex virus culture  4. Aphthous ulcer of mouth - Dyclonine HCl 2 MG LOZG; Use as directed 1 lozenge (2 mg total) in the mouth or throat every 6 (six) hours.  Dispense: 18 lozenge; Refill: 0 - dexamethasone (DECADRON) 0.5 MG/5ML solution; Take 5 mLs (0.5 mg total) by mouth 3 (three) times daily. Keep liquid in mouth for 5 min. Do not rinse. Avoid eating for 30 min after  Dispense: 100 mL; Refill: 0   [name], PA-C  Primary [facility] at [place] 08/16/2017 5:02 PM

## 2017-08-17 ENCOUNTER — Telehealth: Payer: Self-pay | Admitting: Physician Assistant

## 2017-08-17 ENCOUNTER — Encounter: Payer: Self-pay | Admitting: Physician Assistant

## 2017-08-17 LAB — CMP14+EGFR
ALT: 20 IU/L (ref 0–44)
AST: 27 IU/L (ref 0–40)
Albumin/Globulin Ratio: 1.9 (ref 1.2–2.2)
Albumin: 5 g/dL (ref 3.5–5.5)
Alkaline Phosphatase: 61 IU/L (ref 39–117)
BUN/Creatinine Ratio: 8 — ABNORMAL LOW (ref 9–20)
BUN: 9 mg/dL (ref 6–24)
Bilirubin Total: 0.5 mg/dL (ref 0.0–1.2)
CO2: 23 mmol/L (ref 20–29)
Calcium: 9.4 mg/dL (ref 8.7–10.2)
Chloride: 95 mmol/L — ABNORMAL LOW (ref 96–106)
Creatinine, Ser: 1.08 mg/dL (ref 0.76–1.27)
GFR calc Af Amer: 92 mL/min/{1.73_m2} (ref >59)
GFR calc non Af Amer: 80 mL/min/{1.73_m2} (ref >59)
Globulin, Total: 2.6 g/dL (ref 1.5–4.5)
Glucose: 100 mg/dL — ABNORMAL HIGH (ref 65–99)
Potassium: 4 mmol/L (ref 3.5–5.2)
Sodium: 137 mmol/L (ref 134–144)
Total Protein: 7.6 g/dL (ref 6.0–8.5)

## 2017-08-17 LAB — PT AND PTT
INR: 1 (ref 0.8–1.2)
Prothrombin Time: 10.4 s (ref 9.1–12.0)
aPTT: 31 s (ref 24–33)

## 2017-08-17 LAB — C-REACTIVE PROTEIN: CRP: 20 mg/L — ABNORMAL HIGH (ref 0–10)

## 2017-08-17 LAB — VITAMIN B12: Vitamin B-12: 530 pg/mL (ref 232–1245)

## 2017-08-17 LAB — FIBRINOGEN: Fibrinogen: 473 mg/dL (ref 193–507)

## 2017-08-17 LAB — SEDIMENTATION RATE: Sed Rate: 39 mm/h — ABNORMAL HIGH (ref 0–30)

## 2017-08-17 NOTE — Telephone Encounter (Signed)
Copied from CRM [phone]#[id number]. Topic: Quick Communication - See Telephone Encounter >> Aug 17, 2017 12:39 PM Arlyss Gandyichardson, Mardelle Pandolfi N, NT wrote: CRM for notification. See Telephone encounter for: 08/17/17. Pt states that the pharmacy will not have the dexamethasone (DECADRON) 0.5 MG/5ML solution in until tomorrow and he is feeling worse today. Also, they stated to the pt they did not receive the Dyclonine HCl 2 MG LOZG yesterday. Pt is wanting to see if the meds can be sent to another [facility]? Please advise.

## 2017-08-17 NOTE — Telephone Encounter (Signed)
[facility] [employer] and she said she will go speak with the dr and have the nurse call him back

## 2017-08-18 ENCOUNTER — Other Ambulatory Visit: Payer: Self-pay | Admitting: Physician Assistant

## 2017-08-18 ENCOUNTER — Other Ambulatory Visit: Payer: Self-pay | Admitting: *Deleted

## 2017-08-18 ENCOUNTER — Encounter: Payer: Self-pay | Admitting: Physician Assistant

## 2017-08-18 DIAGNOSIS — J45990 Exercise induced bronchospasm: Secondary | ICD-10-CM

## 2017-08-18 DIAGNOSIS — R6883 Chills (without fever): Secondary | ICD-10-CM

## 2017-08-18 DIAGNOSIS — K120 Recurrent oral aphthae: Secondary | ICD-10-CM

## 2017-08-18 DIAGNOSIS — D696 Thrombocytopenia, unspecified: Secondary | ICD-10-CM

## 2017-08-18 LAB — HERPES SIMPLEX VIRUS CULTURE

## 2017-08-18 MED ORDER — DEXAMETHASONE 0.5 MG/5ML PO SOLN: 0.5000 mg | Freq: Three times a day (TID) | ORAL | 0 refills | Status: DC

## 2017-08-18 MED ORDER — DYCLONINE HCL 2 MG MT LOZG: 1.0000 | LOZENGE | Freq: Four times a day (QID) | OROMUCOSAL | 0 refills | Status: DC

## 2017-08-18 MED ORDER — PREDNISONE 20 MG PO TABS: ORAL_TABLET | ORAL | 0 refills | Status: DC

## 2017-08-18 NOTE — Addendum Note (Signed)
Addended by: Sebastian AcheMCVEY, Ariauna Farabee WHITNEY on: 08/18/2017 11:01 AM   Modules accepted: Orders

## 2017-08-18 NOTE — Telephone Encounter (Signed)
See message below °

## 2017-08-20 LAB — PATHOLOGIST SMEAR REVIEW
Basophils Absolute: 0 10*3/uL (ref 0.0–0.2)
Basos: 1 %
EOS (ABSOLUTE): 0 10*3/uL (ref 0.0–0.4)
Eos: 0 %
Hematocrit: 42.3 % (ref 37.5–51.0)
Hemoglobin: 14.2 g/dL (ref 13.0–17.7)
Immature Grans (Abs): 0 10*3/uL (ref 0.0–0.1)
Immature Granulocytes: 0 %
Lymphocytes Absolute: 1.5 10*3/uL (ref 0.7–3.1)
Lymphs: 24 %
MCH: 30.4 pg (ref 26.6–33.0)
MCHC: 33.6 g/dL (ref 31.5–35.7)
MCV: 91 fL (ref 79–97)
Monocytes Absolute: 1.1 10*3/uL — ABNORMAL HIGH (ref 0.1–0.9)
Monocytes: 17 %
Neutrophils Absolute: 3.8 10*3/uL (ref 1.4–7.0)
Neutrophils: 58 %
Path Rev PLTs: NORMAL
Path Rev RBC: NORMAL
Platelets: 151 10*3/uL (ref 150–450)
RBC: 4.67 x10E6/uL (ref 4.14–5.80)
RDW: 13.3 % (ref 12.3–15.4)
WBC: 6.5 10*3/uL (ref 3.4–10.8)

## 2017-08-21 ENCOUNTER — Telehealth: Payer: Self-pay | Admitting: Physician Assistant

## 2017-08-21 NOTE — Telephone Encounter (Signed)
Copied from CRM [phone]#[id number]. Topic: General - Other >> Aug 21, 2017  8:19 AM Gaynelle AduPoole, Shalonda wrote: Reason for CRM:  patient is requesting to  schedule labs. Please advise

## 2017-08-22 ENCOUNTER — Ambulatory Visit (AMBULATORY_SURGERY_CENTER): Payer: Self-pay

## 2017-08-22 VITALS — Ht 72.0 in | Wt 183.0 lb

## 2017-08-22 DIAGNOSIS — Z1211 Encounter for screening for malignant neoplasm of colon: Secondary | ICD-10-CM

## 2017-08-22 MED ORDER — PEG-KCL-NACL-NASULF-NA ASC-C 140 G PO SOLR: 1.0000 | Freq: Once | ORAL | 0 refills | Status: AC

## 2017-08-22 NOTE — Progress Notes (Signed)
Denies allergies to eggs or soy products. Denies complication of anesthesia or sedation. Denies use of weight loss medication. Denies use of O2.   Emmi instructions declined.  

## 2017-08-23 NOTE — Telephone Encounter (Signed)
Left message on voicemail stating he does not need an appointment for labs only.  Patient has labs future ordered in his chart.

## 2017-08-24 ENCOUNTER — Ambulatory Visit: Payer: BLUE CROSS/BLUE SHIELD | Admitting: Physician Assistant

## 2017-08-24 ENCOUNTER — Ambulatory Visit (INDEPENDENT_AMBULATORY_CARE_PROVIDER_SITE_OTHER): Payer: BLUE CROSS/BLUE SHIELD | Admitting: Physician Assistant

## 2017-08-24 DIAGNOSIS — D696 Thrombocytopenia, unspecified: Secondary | ICD-10-CM | POA: Diagnosis not present

## 2017-08-24 DIAGNOSIS — R7989 Other specified abnormal findings of blood chemistry: Secondary | ICD-10-CM | POA: Diagnosis not present

## 2017-08-24 DIAGNOSIS — R6883 Chills (without fever): Secondary | ICD-10-CM | POA: Diagnosis not present

## 2017-08-25 ENCOUNTER — Other Ambulatory Visit: Payer: Self-pay | Admitting: Physician Assistant

## 2017-08-25 DIAGNOSIS — R7989 Other specified abnormal findings of blood chemistry: Secondary | ICD-10-CM

## 2017-08-25 LAB — CBC WITH DIFFERENTIAL/PLATELET
Basophils Absolute: 0 10*3/uL (ref 0.0–0.2)
Basos: 1 %
EOS (ABSOLUTE): 0 10*3/uL (ref 0.0–0.4)
Eos: 0 %
Hematocrit: 41.6 % (ref 37.5–51.0)
Hemoglobin: 13.7 g/dL (ref 13.0–17.7)
Immature Grans (Abs): 0.1 10*3/uL (ref 0.0–0.1)
Immature Granulocytes: 1 %
Lymphocytes Absolute: 1.8 10*3/uL (ref 0.7–3.1)
Lymphs: 26 %
MCH: 30.3 pg (ref 26.6–33.0)
MCHC: 32.9 g/dL (ref 31.5–35.7)
MCV: 92 fL (ref 79–97)
Monocytes Absolute: 0.8 10*3/uL (ref 0.1–0.9)
Monocytes: 11 %
Neutrophils Absolute: 4.4 10*3/uL (ref 1.4–7.0)
Neutrophils: 61 %
Platelets: 200 10*3/uL (ref 150–450)
RBC: 4.52 x10E6/uL (ref 4.14–5.80)
RDW: 13.2 % (ref 12.3–15.4)
WBC: 7.1 10*3/uL (ref 3.4–10.8)

## 2017-08-25 LAB — THYROID PANEL WITH TSH
Free Thyroxine Index: 2.4 (ref 1.2–4.9)
T3 Uptake Ratio: 30 % (ref 24–39)
T4, Total: 8 ug/dL (ref 4.5–12.0)
TSH: 8.12 u[IU]/mL — ABNORMAL HIGH (ref 0.450–4.500)

## 2017-08-25 LAB — C-REACTIVE PROTEIN: CRP: 1 mg/L (ref 0–10)

## 2017-08-25 LAB — SEDIMENTATION RATE: Sed Rate: 4 mm/hr (ref 0–30)

## 2017-08-25 LAB — HIV ANTIBODY (ROUTINE TESTING W REFLEX): HIV Screen 4th Generation wRfx: NONREACTIVE

## 2017-08-25 NOTE — Progress Notes (Signed)
TSH is improving. RTC in 3 months for lab only

## 2017-08-28 DIAGNOSIS — D2210 Melanocytic nevi of unspecified eyelid, including canthus: Secondary | ICD-10-CM | POA: Diagnosis not present

## 2017-09-01 ENCOUNTER — Other Ambulatory Visit: Payer: Self-pay | Admitting: Physician Assistant

## 2017-09-01 ENCOUNTER — Encounter: Payer: Self-pay | Admitting: Physician Assistant

## 2017-09-01 DIAGNOSIS — B009 Herpesviral infection, unspecified: Secondary | ICD-10-CM

## 2017-09-01 MED ORDER — VALACYCLOVIR HCL 1 G PO TABS: 1000.0000 mg | ORAL_TABLET | Freq: Two times a day (BID) | ORAL | 6 refills | Status: DC

## 2017-09-06 ENCOUNTER — Encounter: Payer: Self-pay | Admitting: Gastroenterology

## 2017-09-12 ENCOUNTER — Encounter: Payer: Self-pay | Admitting: Physician Assistant

## 2017-09-12 ENCOUNTER — Other Ambulatory Visit: Payer: Self-pay

## 2017-09-12 ENCOUNTER — Ambulatory Visit: Payer: BLUE CROSS/BLUE SHIELD | Admitting: Physician Assistant

## 2017-09-12 VITALS — BP 116/80 | HR 63 | Temp 98.1°F | Resp 16 | Ht 71.5 in | Wt 180.6 lb

## 2017-09-12 DIAGNOSIS — B002 Herpesviral gingivostomatitis and pharyngotonsillitis: Secondary | ICD-10-CM | POA: Insufficient documentation

## 2017-09-12 DIAGNOSIS — B009 Herpesviral infection, unspecified: Secondary | ICD-10-CM

## 2017-09-12 NOTE — Progress Notes (Signed)
   Dustin Mitchell  MRN: [id number] DOB: [birth year]  PCP: Dustin Mitchell, No Pcp Per  Subjective:  Pt is a pleasant 52 year old male who presents to clinic for discussion of recent labs.  HSV culture was positive for HSV 1 on 7/24. He is surprised by these results and has a lot of questions regarding HSV.   Review of Systems  Constitutional: Negative for chills and fever.  Skin: Positive for rash.    Dustin Mitchell Active Problem List   Diagnosis Date Noted  . Exercise-induced asthma 05/30/2017  . SDH (subdural hematoma) ([facility]) 03/27/2014    Current Outpatient Medications on File Prior to Visit  Medication Sig Dispense Refill  . albuterol (PROVENTIL HFA;VENTOLIN HFA) 108 (90 Base) MCG/ACT inhaler Inhale 2 puffs into the lungs every 4 (four) hours as needed for wheezing or shortness of breath (cough, shortness of breath or wheezing.). 1 Inhaler 1  . Clobetasol Propionate 0.05 % lotion Apply 1 application topically 2 (two) times daily. 59 mL 0  . cetirizine (ZYRTEC) 10 MG tablet Take 10 mg by mouth daily.    . valACYclovir (VALTREX) 1000 MG tablet Take 1 tablet (1,000 mg total) by mouth 2 (two) times daily. At first sign of outbreak. Take for 3 days. (Dustin Mitchell not taking: Reported on 09/12/2017) 12 tablet 6   No current facility-administered medications on file prior to visit.     Allergies  Allergen Reactions  . Tylenol [Acetaminophen] Swelling  . Penicillins     unknown  . Shellfish Allergy Nausea And Vomiting     Objective:  BP 116/80 (BP Location: Left Arm, Dustin Mitchell Position: Sitting, Cuff Size: Normal)   Pulse 63   Temp 98.1 F (36.7 C) (Oral)   Resp 16   Ht 5' 11.5" (1.816 m)   Wt 180 lb 9.6 oz (81.9 kg)   SpO2 100%   BMI 24.84 kg/m   Physical Exam  Constitutional: He is oriented to person, place, and time. He appears well-developed and well-nourished.  Neurological: He is alert and oriented to person, place, and time.  Skin: Skin is warm and dry.  Psychiatric: He has a  normal mood and affect. His behavior is normal. Judgment and thought content normal.  Vitals reviewed.   Assessment and Plan :  1. Oral herpes simplex, not currently active - pt presents for discussion regarding recent positive HSV culture. Spent 10 mins with the Dustin Mitchell - greater than 50% of the visit was face to face counseling Dustin Mitchell regarding HSV etiology, transmission, prevention and treatment.  Marco CollieWhitney Violet Cart, PA-C  Primary Care at [facility] 09/12/2017 3:19 PM  Please note: Portions of this report may have been transcribed using dragon voice recognition software. Every effort was made to ensure accuracy; however, inadvertent computerized transcription errors may be present.

## 2017-09-12 NOTE — Patient Instructions (Signed)
° ° ° °  If you have lab work done today you will be contacted with your lab results within the next 2 weeks.  If you have not heard from us then please contact us. The fastest way to get your results is to register for My Chart. ° ° °IF you received an x-ray today, you will receive an invoice from [facility]. Please contact  Radiology at [phone] with questions or concerns regarding your invoice.  ° °IF you received labwork today, you will receive an invoice from [facility]. Please contact [facility] at [phone] with questions or concerns regarding your invoice.  ° °Our billing staff will not be able to assist you with questions regarding bills from these companies. ° °You will be contacted with the lab results as soon as they are available. The fastest way to get your results is to activate your My Chart account. Instructions are located on the last page of this paperwork. If you have not heard from us regarding the results in 2 weeks, please contact this office. °  ° ° ° °

## 2017-09-14 ENCOUNTER — Ambulatory Visit (AMBULATORY_SURGERY_CENTER): Payer: BLUE CROSS/BLUE SHIELD | Admitting: Gastroenterology

## 2017-09-14 ENCOUNTER — Encounter: Payer: Self-pay | Admitting: Gastroenterology

## 2017-09-14 VITALS — BP 116/70 | HR 50 | Temp 98.6°F | Resp 17 | Ht 72.0 in | Wt 183.0 lb

## 2017-09-14 DIAGNOSIS — Z1211 Encounter for screening for malignant neoplasm of colon: Secondary | ICD-10-CM | POA: Diagnosis not present

## 2017-09-14 MED ORDER — SODIUM CHLORIDE 0.9 % IV SOLN: 500.0000 mL | Freq: Once | INTRAVENOUS | Status: DC

## 2017-09-14 NOTE — Progress Notes (Signed)
Report given to PACU, vss 

## 2017-09-14 NOTE — Progress Notes (Signed)
Pt's states no medical or surgical changes since previsit or office visit. 

## 2017-09-14 NOTE — Patient Instructions (Signed)
YOU HAD AN ENDOSCOPIC PROCEDURE TODAY AT THE [facility]:   Refer to the procedure report that was given to you for any specific questions about what was found during the examination.  If the procedure report does not answer your questions, please call your gastroenterologist to clarify.  If you requested that your care partner not be given the details of your procedure findings, then the procedure report has been included in a sealed envelope for you to review at your convenience later.  YOU SHOULD EXPECT: Some feelings of bloating in the abdomen. Passage of more gas than usual.  Walking can help get rid of the air that was put into your GI tract during the procedure and reduce the bloating. If you had a lower endoscopy (such as a colonoscopy or flexible sigmoidoscopy) you may notice spotting of blood in your stool or on the toilet paper. If you underwent a bowel prep for your procedure, you may not have a normal bowel movement for a few days.  Please Note:  You might notice some irritation and congestion in your nose or some drainage.  This is from the oxygen used during your procedure.  There is no need for concern and it should clear up in a day or so.  SYMPTOMS TO REPORT IMMEDIATELY:   Following lower endoscopy (colonoscopy or flexible sigmoidoscopy):  Excessive amounts of blood in the stool  Significant tenderness or worsening of abdominal pains  Swelling of the abdomen that is new, acute  Fever of 100F or higher   For urgent or emergent issues, a gastroenterologist can be reached at any hour by calling [phone].   DIET:  We do recommend a small meal at first, but then you may proceed to your regular diet.  Drink plenty of fluids but you should avoid alcoholic beverages for 24 hours.  ACTIVITY:  You should plan to take it easy for the rest of today and you should NOT DRIVE or use heavy machinery until tomorrow (because of the sedation medicines used during the test).     FOLLOW UP: Our staff will call the number listed on your records the next business day following your procedure to check on you and address any questions or concerns that you may have regarding the information given to you following your procedure. If we do not reach you, we will leave a message.  However, if you are feeling well and you are not experiencing any problems, there is no need to return our call.  We will assume that you have returned to your regular daily activities without incident.    SIGNATURES/CONFIDENTIALITY: You and/or your care partner have signed paperwork which will be entered into your electronic medical record.  These signatures attest to the fact that that the information above on your After Visit Summary has been reviewed and is understood.  Full responsibility of the confidentiality of this discharge information lies with you and/or your care-partner.  We will see you again in 3 years.

## 2017-09-14 NOTE — Op Note (Signed)
[facility] Patient Name: Dustin Mitchell Procedure Date: 09/14/2017 9:14 AM MRN: [id number] Endoscopist: Sherilyn Cooter L. Myrtie Neither , MD Age: 52 Referring MD:  Date of Birth: 01-10-1966 Gender: Male Account #: [id number] Procedure:                Colonoscopy Indications:              Screening for colorectal malignant neoplasm, This                            is the patient's first colonoscopy Medicines:                Monitored Anesthesia Care Procedure:                Pre-Anesthesia Assessment:                           - Prior to the procedure, a History and Physical                            was performed, and patient medications and                            allergies were reviewed. The patient's tolerance of                            previous anesthesia was also reviewed. The risks                            and benefits of the procedure and the sedation                            options and risks were discussed with the patient.                            All questions were answered, and informed consent                            was obtained. Prior Anticoagulants: The patient has                            taken no previous anticoagulant or antiplatelet                            agents. ASA Grade Assessment: II - A patient with                            mild systemic disease. After reviewing the risks                            and benefits, the patient was deemed in                            satisfactory condition to undergo the procedure.  After obtaining informed consent, the colonoscope                            was passed under direct vision. Throughout the                            procedure, the patient's blood pressure, pulse, and                            oxygen saturations were monitored continuously. The                            Colonoscope was introduced through the anus and                            advanced to the the cecum,  identified by                            appendiceal orifice and ileocecal valve. The                            colonoscopy was performed without difficulty. The                            patient tolerated the procedure well. The quality                            of the bowel preparation was excellent. The                            ileocecal valve, appendiceal orifice, and rectum                            were photographed. Scope In: 9:19:29 AM Scope Out: 9:33:01 AM Scope Withdrawal Time: 0 hours 8 minutes 59 seconds  Total Procedure Duration: 0 hours 13 minutes 32 seconds  Findings:                 The perianal and digital rectal examinations were                            normal.                           The entire examined colon appeared normal on direct                            and retroflexion views. Complications:            No immediate complications. Estimated Blood Loss:     Estimated blood loss: none. Impression:               - The entire examined colon is normal on direct and                            retroflexion views.                           -  No specimens collected. Recommendation:           - Patient has a contact number available for                            emergencies. The signs and symptoms of potential                            delayed complications were discussed with the                            patient. Return to normal activities tomorrow.                            Written discharge instructions were provided to the                            patient.                           - Resume previous diet.                           - Continue present medications.                           - Repeat colonoscopy in 10 years for screening                            purposes. Henry L. Myrtie Neither, MD 09/14/2017 9:41:10 AM This report has been signed electronically.

## 2017-09-15 ENCOUNTER — Telehealth: Payer: Self-pay | Admitting: *Deleted

## 2017-09-15 ENCOUNTER — Telehealth: Payer: Self-pay

## 2017-09-15 NOTE — Telephone Encounter (Signed)
  Follow up Call-  Call Dustin Mitchell number 09/14/2017  Post procedure Call Dustin Mitchell phone  # [phone]  Permission to leave phone message Yes  Some recent data might be hidden     Patient questions:  Do you have a fever, pain , or abdominal swelling? No. Pain Score  0 *  Have you tolerated food without any problems? Yes.    Have you been able to return to your normal activities? Yes.    Do you have any questions about your discharge instructions: Diet   No. Medications  No. Follow up visit  No.  Do you have questions or concerns about your Care? No.  Actions: * If pain score is 4 or above: No action needed, pain <4.

## 2017-09-15 NOTE — Telephone Encounter (Signed)
First follow up call attempt.  Voicemail with name identifier.  Message left to call if any questions or concerns. 

## 2017-10-09 DIAGNOSIS — Z970 Presence of artificial eye: Secondary | ICD-10-CM | POA: Diagnosis not present

## 2017-10-09 DIAGNOSIS — H40012 Open angle with borderline findings, low risk, left eye: Secondary | ICD-10-CM | POA: Diagnosis not present

## 2017-11-08 NOTE — Progress Notes (Signed)
Lab orders only 

## 2018-10-10 DIAGNOSIS — Z970 Presence of artificial eye: Secondary | ICD-10-CM | POA: Diagnosis not present

## 2018-10-10 DIAGNOSIS — H40012 Open angle with borderline findings, low risk, left eye: Secondary | ICD-10-CM | POA: Diagnosis not present

## 2019-05-13 ENCOUNTER — Other Ambulatory Visit: Payer: Self-pay | Admitting: Physician Assistant

## 2019-05-13 DIAGNOSIS — L255 Unspecified contact dermatitis due to plants, except food: Secondary | ICD-10-CM

## 2019-05-13 NOTE — Telephone Encounter (Signed)
Pt called back to schedule toc and would like to know if he can have prescription filled in the meantime. Pt understands he has to keep appt

## 2019-05-13 NOTE — Telephone Encounter (Signed)
Attempted to call the patient to schedule an appointment

## 2019-05-13 NOTE — Telephone Encounter (Signed)
Please advise on message below. Pt would like a refill on the lotion. Pt has not been seen by provider yet. Scheduled for 05/24/19.

## 2019-05-24 ENCOUNTER — Telehealth: Payer: BLUE CROSS/BLUE SHIELD | Admitting: Adult Health Nurse Practitioner

## 2019-05-29 ENCOUNTER — Other Ambulatory Visit: Payer: Self-pay

## 2019-05-29 ENCOUNTER — Encounter: Payer: Self-pay | Admitting: Family Medicine

## 2019-05-29 ENCOUNTER — Ambulatory Visit: Payer: BC Managed Care – PPO | Admitting: Family Medicine

## 2019-05-29 VITALS — BP 120/80 | HR 48 | Temp 96.8°F | Ht 71.5 in | Wt 188.4 lb

## 2019-05-29 DIAGNOSIS — Z8639 Personal history of other endocrine, nutritional and metabolic disease: Secondary | ICD-10-CM

## 2019-05-29 DIAGNOSIS — J302 Other seasonal allergic rhinitis: Secondary | ICD-10-CM | POA: Diagnosis not present

## 2019-05-29 DIAGNOSIS — Z862 Personal history of diseases of the blood and blood-forming organs and certain disorders involving the immune mechanism: Secondary | ICD-10-CM | POA: Diagnosis not present

## 2019-05-29 DIAGNOSIS — Z9109 Other allergy status, other than to drugs and biological substances: Secondary | ICD-10-CM | POA: Diagnosis not present

## 2019-05-29 DIAGNOSIS — H9313 Tinnitus, bilateral: Secondary | ICD-10-CM

## 2019-05-29 DIAGNOSIS — Z7689 Persons encountering health services in other specified circumstances: Secondary | ICD-10-CM

## 2019-05-29 MED ORDER — TRIAMCINOLONE ACETONIDE 0.1 % EX CREA: 1.0000 "application " | TOPICAL_CREAM | Freq: Two times a day (BID) | CUTANEOUS | 0 refills | Status: AC

## 2019-05-29 NOTE — Patient Instructions (Signed)
It was a pleasure meeting you today.   I will be in touch with your lab results.
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
## 2019-05-30 LAB — CBC WITH DIFFERENTIAL/PLATELET
Basophils Absolute: 0 10*3/uL (ref 0.0–0.2)
Basos: 1 %
EOS (ABSOLUTE): 0.1 10*3/uL (ref 0.0–0.4)
Eos: 2 %
Hematocrit: 40.4 % (ref 37.5–51.0)
Hemoglobin: 13.6 g/dL (ref 13.0–17.7)
Immature Grans (Abs): 0 10*3/uL (ref 0.0–0.1)
Immature Granulocytes: 0 %
Lymphocytes Absolute: 1.5 10*3/uL (ref 0.7–3.1)
Lymphs: 36 %
MCH: 31.2 pg (ref 26.6–33.0)
MCHC: 33.7 g/dL (ref 31.5–35.7)
MCV: 93 fL (ref 79–97)
Monocytes Absolute: 0.5 10*3/uL (ref 0.1–0.9)
Monocytes: 12 %
Neutrophils Absolute: 2 10*3/uL (ref 1.4–7.0)
Neutrophils: 49 %
Platelets: 164 10*3/uL (ref 150–450)
RBC: 4.36 x10E6/uL (ref 4.14–5.80)
RDW: 12.7 % (ref 11.6–15.4)
WBC: 4 10*3/uL (ref 3.4–10.8)

## 2019-05-30 LAB — T4, FREE: Free T4: 1.16 ng/dL (ref 0.82–1.77)

## 2019-05-30 LAB — COMPREHENSIVE METABOLIC PANEL
ALT: 19 IU/L (ref 0–44)
AST: 25 IU/L (ref 0–40)
Albumin/Globulin Ratio: 2 (ref 1.2–2.2)
Albumin: 4.9 g/dL (ref 3.8–4.9)
Alkaline Phosphatase: 57 IU/L (ref 39–117)
BUN/Creatinine Ratio: 12 (ref 9–20)
BUN: 13 mg/dL (ref 6–24)
Bilirubin Total: 1.2 mg/dL (ref 0.0–1.2)
CO2: 23 mmol/L (ref 20–29)
Calcium: 9.7 mg/dL (ref 8.7–10.2)
Chloride: 103 mmol/L (ref 96–106)
Creatinine, Ser: 1.06 mg/dL (ref 0.76–1.27)
GFR calc Af Amer: 93 mL/min/{1.73_m2} (ref >59)
GFR calc non Af Amer: 80 mL/min/{1.73_m2} (ref >59)
Globulin, Total: 2.4 g/dL (ref 1.5–4.5)
Glucose: 87 mg/dL (ref 65–99)
Potassium: 4.4 mmol/L (ref 3.5–5.2)
Sodium: 141 mmol/L (ref 134–144)
Total Protein: 7.3 g/dL (ref 6.0–8.5)

## 2019-05-30 LAB — IRON,TIBC AND FERRITIN PANEL
Ferritin: 71 ng/mL (ref 30–400)
Iron Saturation: 51 % (ref 15–55)
Iron: 181 ug/dL — ABNORMAL HIGH (ref 38–169)
Total Iron Binding Capacity: 356 ug/dL (ref 250–450)
UIBC: 175 ug/dL (ref 111–343)

## 2019-05-30 LAB — T3: T3, Total: 99 ng/dL (ref 71–180)

## 2019-05-30 LAB — TSH: TSH: 7.25 u[IU]/mL — ABNORMAL HIGH (ref 0.450–4.500)

## 2019-05-31 ENCOUNTER — Encounter: Payer: Self-pay | Admitting: Family Medicine

## 2019-10-10 DIAGNOSIS — H40012 Open angle with borderline findings, low risk, left eye: Secondary | ICD-10-CM | POA: Diagnosis not present

## 2019-10-10 DIAGNOSIS — H43812 Vitreous degeneration, left eye: Secondary | ICD-10-CM | POA: Diagnosis not present

## 2019-10-10 DIAGNOSIS — Z970 Presence of artificial eye: Secondary | ICD-10-CM | POA: Diagnosis not present

## 2021-02-04 DIAGNOSIS — H43812 Vitreous degeneration, left eye: Secondary | ICD-10-CM | POA: Diagnosis not present

## 2021-02-04 DIAGNOSIS — H40012 Open angle with borderline findings, low risk, left eye: Secondary | ICD-10-CM | POA: Diagnosis not present

## 2021-02-04 DIAGNOSIS — Z970 Presence of artificial eye: Secondary | ICD-10-CM | POA: Diagnosis not present

## 2021-08-05 DIAGNOSIS — L247 Irritant contact dermatitis due to plants, except food: Secondary | ICD-10-CM | POA: Diagnosis not present

## 2021-08-05 DIAGNOSIS — L814 Other melanin hyperpigmentation: Secondary | ICD-10-CM | POA: Diagnosis not present

## 2021-08-05 DIAGNOSIS — D225 Melanocytic nevi of trunk: Secondary | ICD-10-CM | POA: Diagnosis not present

## 2021-08-05 DIAGNOSIS — L578 Other skin changes due to chronic exposure to nonionizing radiation: Secondary | ICD-10-CM | POA: Diagnosis not present

## 2021-12-08 ENCOUNTER — Encounter: Payer: Self-pay | Admitting: Internal Medicine
